# Patient Record
Sex: Female | Born: 1970 | Hispanic: No | Marital: Married | Smoking: Never smoker
Health system: Southern US, Community
[De-identification: ages and names within clinical notes are randomized; demographics above are authoritative.]

## PROBLEM LIST (undated history)

## (undated) DIAGNOSIS — Z789 Other specified health status: Secondary | ICD-10-CM

## (undated) HISTORY — PX: TUBAL LIGATION: SHX77

## (undated) HISTORY — DX: Other specified health status: Z78.9

---

## 1998-12-15 ENCOUNTER — Emergency Department (HOSPITAL_COMMUNITY): Admission: EM | Admit: 1998-12-15 | Discharge: 1998-12-15 | Payer: Self-pay | Admitting: Emergency Medicine

## 2017-10-10 ENCOUNTER — Emergency Department: Payer: PRIVATE HEALTH INSURANCE

## 2017-10-10 ENCOUNTER — Emergency Department
Admission: EM | Admit: 2017-10-10 | Discharge: 2017-10-11 | Disposition: A | Discharge status: Home / Self Care | Payer: PRIVATE HEALTH INSURANCE | Attending: Emergency Medicine | Admitting: Emergency Medicine

## 2017-10-10 DIAGNOSIS — R0602 Shortness of breath: Secondary | ICD-10-CM | POA: Diagnosis not present

## 2017-10-10 DIAGNOSIS — R079 Chest pain, unspecified: Secondary | ICD-10-CM | POA: Diagnosis present

## 2017-10-10 LAB — CBC
HCT: 42.2 % (ref 35.0–47.0)
HEMOGLOBIN: 13.9 g/dL (ref 12.0–16.0)
MCH: 28.6 pg (ref 26.0–34.0)
MCHC: 32.9 g/dL (ref 32.0–36.0)
MCV: 86.8 fL (ref 80.0–100.0)
PLATELETS: 277 10*3/uL (ref 150–440)
RBC: 4.86 MIL/uL (ref 3.80–5.20)
RDW: 14.6 % — ABNORMAL HIGH (ref 11.5–14.5)
WBC: 6.3 10*3/uL (ref 3.6–11.0)

## 2017-10-10 LAB — BASIC METABOLIC PANEL
ANION GAP: 9 (ref 5–15)
BUN: 13 mg/dL (ref 6–20)
CALCIUM: 9.2 mg/dL (ref 8.9–10.3)
CO2: 25 mmol/L (ref 22–32)
CREATININE: 0.59 mg/dL (ref 0.44–1.00)
Chloride: 105 mmol/L (ref 101–111)
GFR calc Af Amer: >60 mL/min (ref >60)
GLUCOSE: 100 mg/dL — AB (ref 65–99)
Potassium: 3.7 mmol/L (ref 3.5–5.1)
Sodium: 139 mmol/L (ref 135–145)

## 2017-10-10 LAB — TROPONIN I

## 2017-10-10 NOTE — ED Provider Notes (Signed)
[facility] Emergency Department Provider Note  ____________________________________________   First MD Initiated Contact with Patient 10/10/17 2352     (approximate)  I have reviewed the triage vital signs and the nursing notes.   HISTORY  Chief Complaint Chest Pain   HPI Alyssa Mcgee is a 47 y.o. female who self presents to the emergency department with chest pain and shortness of breath that began today.  The pain is substernal moderate severity.  Had insidious onset and has been intermittent.  She was seen at an urgent care earlier today and was advised to come to the emergency department.  Her pain is not ripping or tearing does not go straight to her back.  It is not associated with nausea.  She has no history of coronary artery disease.  No history of DVT or pulmonary embolism.  Nothing in particular seems to make the symptoms better or worse.  It is nonexertional.  History reviewed. No pertinent past medical history.  There are no active problems to display for this patient.   Past Surgical History:  Procedure Laterality Date  . TUBAL LIGATION      Prior to Admission medications   Not on File    Allergies Patient has no known allergies.  No family history on file.  Social History Social History   Tobacco Use  . Smoking status: Never Smoker  . Smokeless tobacco: Never Used  Substance Use Topics  . Alcohol use: Yes  . Drug use: Not on file    Review of Systems Constitutional: No fever/chills Eyes: No visual changes. ENT: No sore throat. Cardiovascular: Positive for chest pain. Respiratory: Positive for shortness of breath. Gastrointestinal: No abdominal pain.  No nausea, no vomiting.  No diarrhea.  No constipation. Genitourinary: Negative for dysuria. Musculoskeletal: Negative for back pain. Skin: Negative for rash. Neurological: Negative for headaches, focal weakness or  numbness.   ____________________________________________   PHYSICAL EXAM:  VITAL SIGNS: ED Triage Vitals  Enc Vitals Group     BP 10/10/17 1935 (!) 151/75     Pulse Rate 10/10/17 1935 (!) 53     Resp 10/10/17 1935 (!) 22     Temp 10/10/17 1935 98.6 F (37 C)     Temp Source 10/10/17 1935 Oral     SpO2 10/10/17 1935 98 %     Weight 10/10/17 1936 213 lb (96.6 kg)     Height --      Head Circumference --      Peak Flow --      Pain Score 10/10/17 1936 6     Pain Loc --      Pain Edu? --      Excl. in GC? --     Constitutional: Alert and oriented x4 pleasant cooperative speaks in full clear sentences Eyes: PERRL EOMI. Head: Atraumatic. Nose: No congestion/rhinnorhea. Mouth/Throat: No trismus Neck: No stridor.   Cardiovascular: Bradycardic rate, regular rhythm. Grossly normal heart sounds.  Good peripheral circulation. Respiratory: Normal respiratory effort.  No retractions. Lungs CTAB and moving good air Gastrointestinal: Soft nontender Musculoskeletal: No lower extremity edema   Neurologic:  Normal speech and language. No gross focal neurologic deficits are appreciated. Skin:  Skin is warm, dry and intact. No rash noted. Psychiatric: Mood and affect are normal. Speech and behavior are normal.    ____________________________________________   DIFFERENTIAL includes but not limited to  Acute coronary syndrome, pulmonary embolism, aortic dissection ____________________________________________   LABS (all labs ordered are listed, but only  abnormal results are displayed)  Labs Reviewed  BASIC METABOLIC PANEL - Abnormal; Notable for the following components:      Result Value   Glucose, Bld 100 (*)    All other components within normal limits  CBC - Abnormal; Notable for the following components:   RDW 14.6 (*)    All other components within normal limits  TROPONIN I  TROPONIN I    Lab work reviewed by me with no signs of acute ischemia  x2 __________________________________________  EKG  ED ECG REPORT I, Merrily BrittleNeil Neveen Daponte, the attending physician, personally viewed and interpreted this ECG.  Date: 10/10/2017 EKG Time: 1940 Rate: 58 Rhythm: Sinus bradycardia QRS Axis: normal Intervals: normal ST/T Wave abnormalities: normal Narrative Interpretation: no evidence of acute ischemia  ED ECG REPORT I, Merrily BrittleNeil Jeramia Saleeby, the attending physician, personally viewed and interpreted this ECG.  Date: 10/11/2017 EKG Time: 0001 Rate: 57 Rhythm: Sinus bradycardia QRS Axis: normal Intervals: normal ST/T Wave abnormalities: normal Narrative Interpretation: no evidence of acute ischemia.  Unchanged from previous  ____________________________________________  RADIOLOGY  Chest x-ray reviewed by me with no acute disease ____________________________________________   PROCEDURES  Procedure(s) performed: no  Procedures  Critical Care performed: no  Observation: no ____________________________________________   INITIAL IMPRESSION / ASSESSMENT AND PLAN / ED COURSE  Pertinent labs & imaging results that were available during my care of the patient were reviewed by me and considered in my medical decision making (see chart for details).  The patient arrives with atypical chest pain and a nonischemic EKG.  2 troponins are negative.  Low suspicion for pulmonary embolism and she is in fact PERC negative.  I had a lengthy discussion with the patient regarding the diagnostic uncertainty however I felt she was suitable for outpatient management and possible provocative testing.  She verbalizes understanding and agreement with the plan.  Strict return precautions have been given.      ____________________________________________   FINAL CLINICAL IMPRESSION(S) / ED DIAGNOSES  Final diagnoses:  Chest pain, unspecified type      NEW MEDICATIONS STARTED DURING THIS VISIT:  There are no discharge medications for this  patient.    Note:  This document was prepared using Dragon voice recognition software and may include unintentional dictation errors.     Merrily Brittleifenbark, Diala Waxman, MD 10/12/17 [phone]

## 2017-10-10 NOTE — ED Triage Notes (Signed)
Patient c/o chest pain and SOB all day today with increasing severity 3 hours ago. Patient seen at urgent care today for same and informed she should go to the ER.

## 2017-10-11 DIAGNOSIS — R079 Chest pain, unspecified: Secondary | ICD-10-CM | POA: Diagnosis not present

## 2017-10-11 LAB — TROPONIN I: Troponin I: <0.03 ng/mL (ref <0.03)

## 2017-10-11 NOTE — Discharge Instructions (Signed)
Fortunately today your blood work and your chest x-ray were reassuring.  Please make an appointment to establish care with cardiology this coming Monday for reevaluation and return to the emergency department sooner for any concerns.  It was a pleasure to take care of you today, and thank you for coming to our emergency department.  If you have any questions or concerns before leaving please ask the nurse to grab me and I'm more than happy to go through your aftercare instructions again.  If you were prescribed any opioid pain medication today such as Norco, Vicodin, Percocet, morphine, hydrocodone, or oxycodone please make sure you do not drive when you are taking this medication as it can alter your ability to drive safely.  If you have any concerns once you are home that you are not improving or are in fact getting worse before you can make it to your follow-up appointment, please do not hesitate to call 911 and come back for further evaluation.  Merrily BrittleNeil Tashauna Caisse, MD  Results for orders placed or performed during the hospital encounter of 10/10/17  Basic metabolic panel  Result Value Ref Range   Sodium 139 135 - 145 mmol/L   Potassium 3.7 3.5 - 5.1 mmol/L   Chloride 105 101 - 111 mmol/L   CO2 25 22 - 32 mmol/L   Glucose, Bld 100 (H) 65 - 99 mg/dL   BUN 13 6 - 20 mg/dL   Creatinine, Ser 9.600.59 0.44 - 1.00 mg/dL   Calcium 9.2 8.9 - 45.410.3 mg/dL   GFR calc non Af Amer >60 >60 mL/min   GFR calc Af Amer >60 >60 mL/min   Anion gap 9 5 - 15  CBC  Result Value Ref Range   WBC 6.3 3.6 - 11.0 K/uL   RBC 4.86 3.80 - 5.20 MIL/uL   Hemoglobin 13.9 12.0 - 16.0 g/dL   HCT 09.842.2 11.935.0 - 14.747.0 %   MCV 86.8 80.0 - 100.0 fL   MCH 28.6 26.0 - 34.0 pg   MCHC 32.9 32.0 - 36.0 g/dL   RDW 82.914.6 (H) 56.211.5 - 13.014.5 %   Platelets 277 150 - 440 K/uL  Troponin I  Result Value Ref Range   Troponin I <0.03 <0.03 ng/mL  Troponin I  Result Value Ref Range   Troponin I <0.03 <0.03 ng/mL   Dg Chest 2 View  Result  Date: 10/10/2017 CLINICAL DATA:  Chest pain shortness of breath EXAM: CHEST - 2 VIEW COMPARISON:  None. FINDINGS: The heart size and mediastinal contours are within normal limits. Both lungs are clear. The visualized skeletal structures are unremarkable. IMPRESSION: No active cardiopulmonary disease. Electronically Signed   By: Deatra RobinsonKevin  Herman M.D.   On: 10/10/2017 19:57

## 2017-10-11 NOTE — ED Notes (Signed)
Patient discharge and follow up information reviewed with patient by ED nursing staff and patient given the opportunity to ask questions pertaining to ED visit and discharge plan of care. Patient advised that should symptoms not continue to improve, resolve entirely, or should new symptoms develop then a follow up visit with their PCP or a return visit to the ED may be warranted. Patient verbalized consent and understanding of discharge plan of care including potential need for further evaluation. Patient being discharged in stable condition per attending ED physician on duty.   Pt declined stratus interpreter, pt wanted her husband to translate.

## 2017-10-14 ENCOUNTER — Telehealth: Payer: Self-pay

## 2017-10-14 NOTE — Telephone Encounter (Signed)
Number was incorrect in system °Unable to contact patient  °Needed to schedule ED FU seen on 10/10/17 with CP  °  °Sent letter to patient ° ° °

## 2019-05-05 IMAGING — CR DG CHEST 2V
1 series · 2 of 2 positions shown · non-contrast
Comparison: None.

CLINICAL DATA: Chest pain shortness of breath

EXAM:
CHEST - 2 VIEW

[Series 1: dg chest 2 view · 0.14mm/px · 2 of 2 slices shown]
[im 1/2]
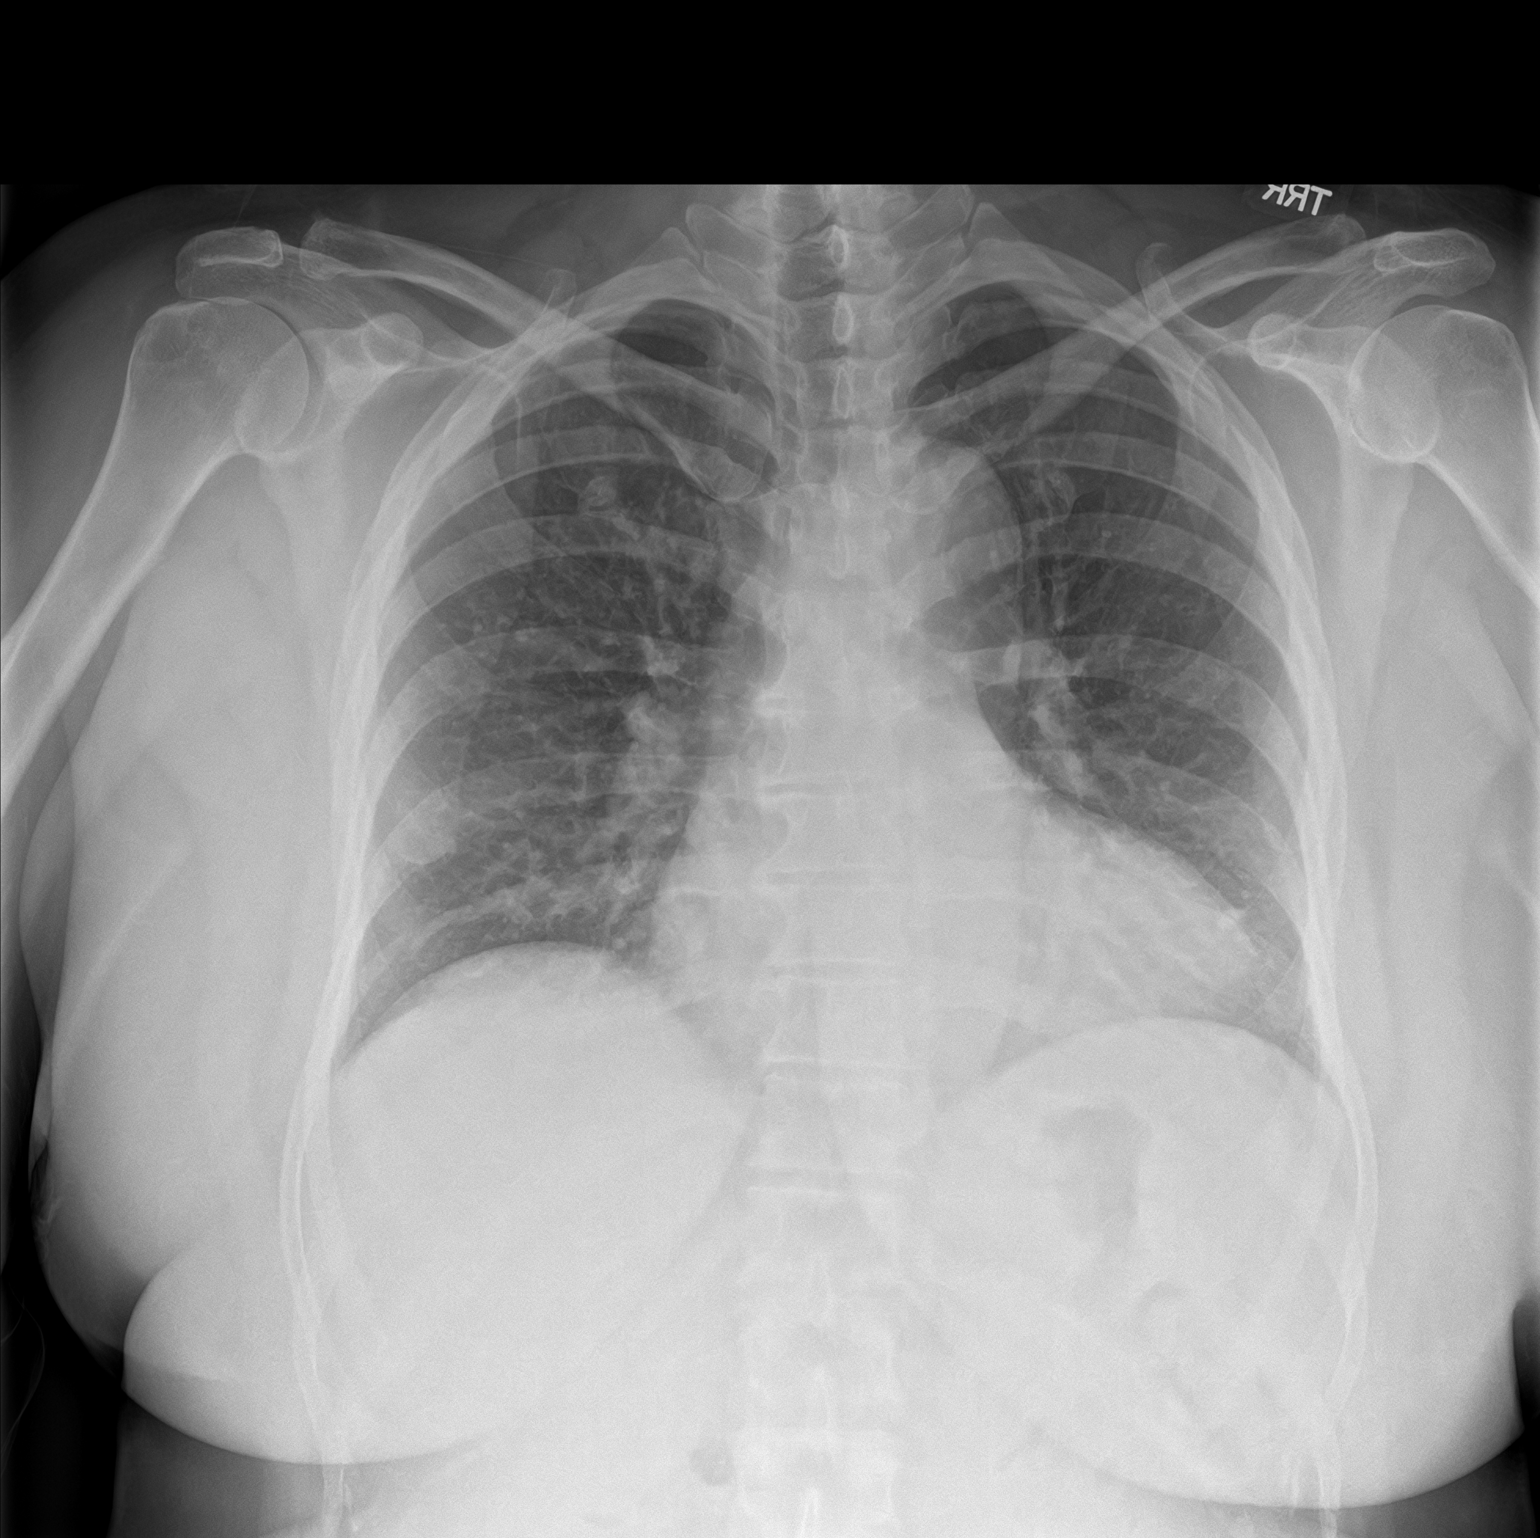
[im 2/2]
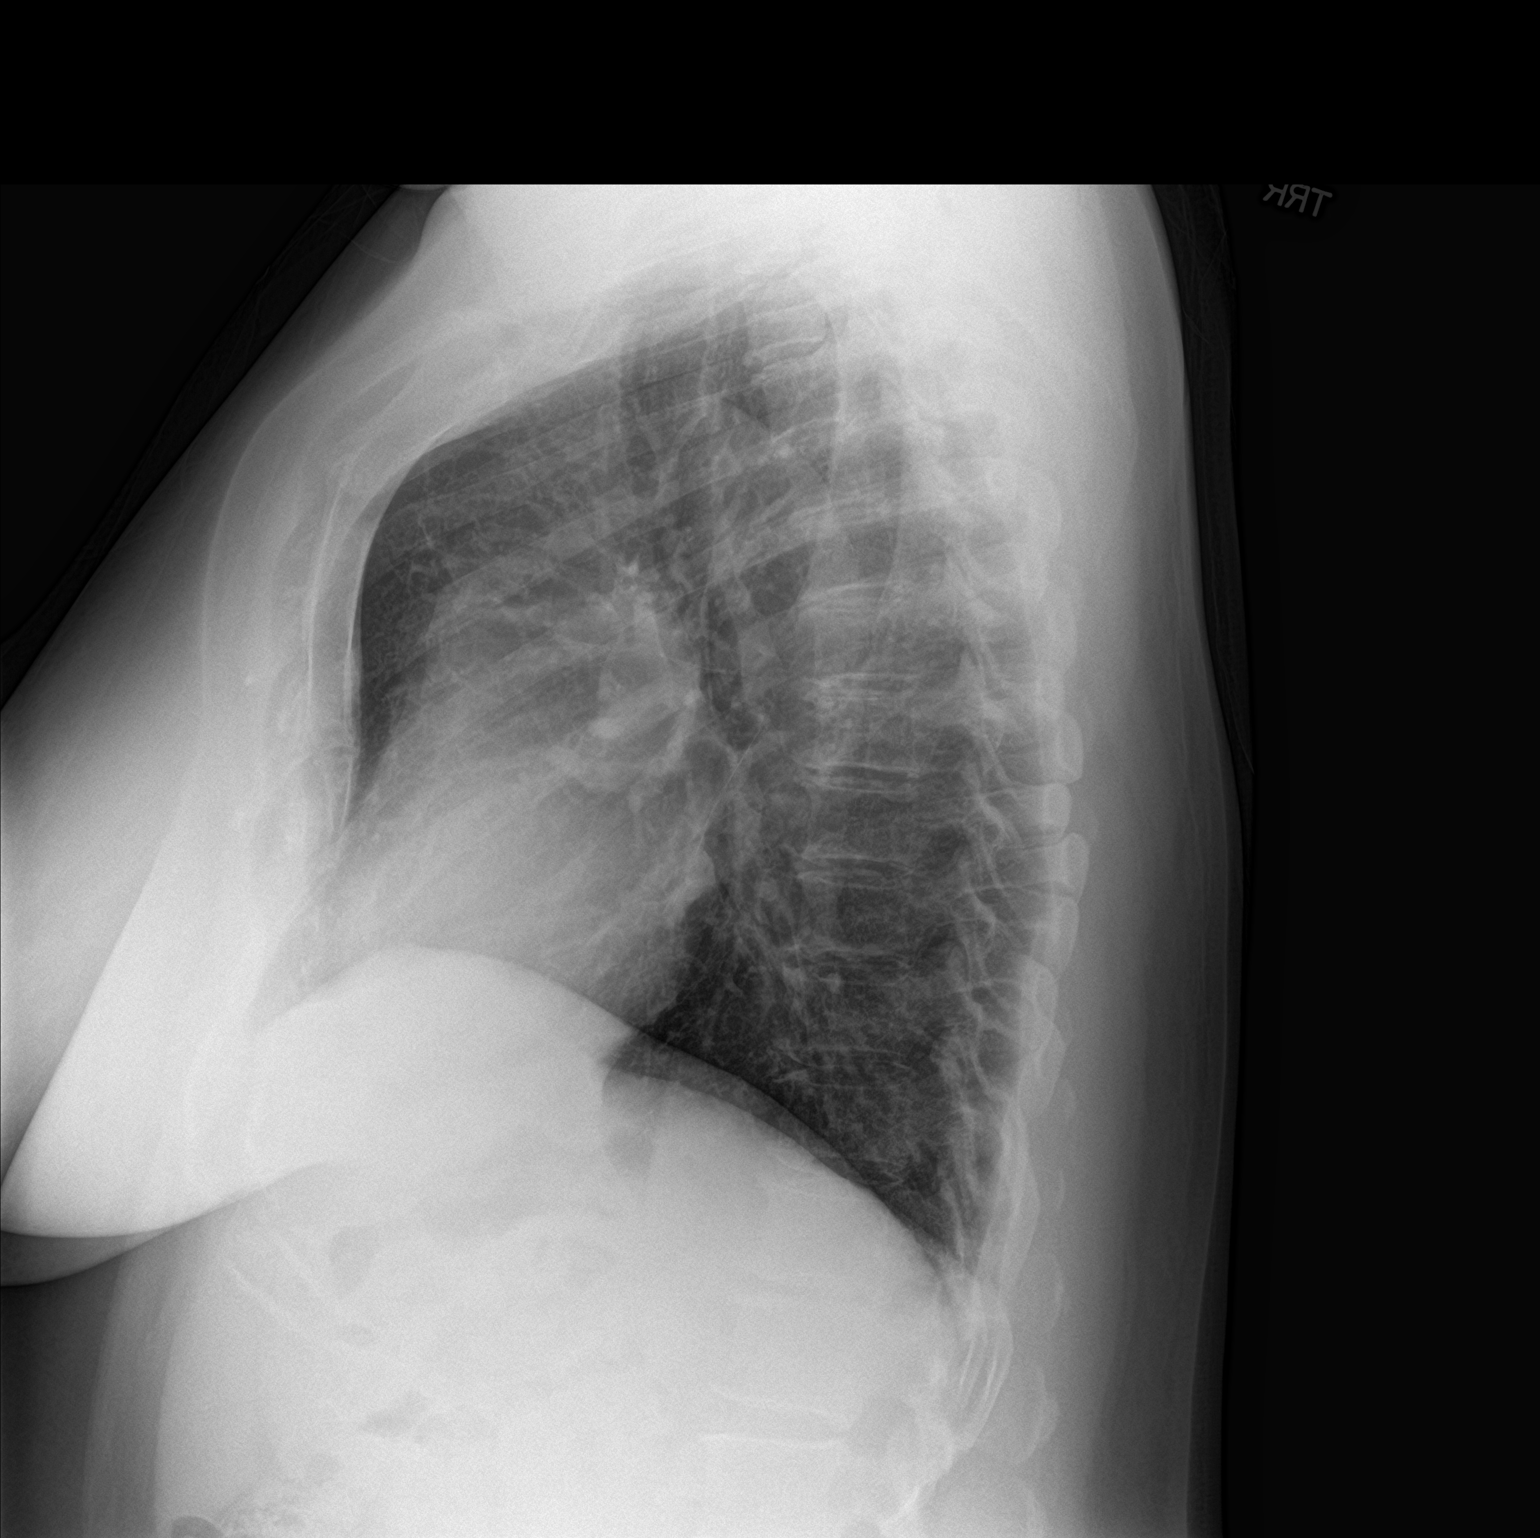

[2 of 2 positions shown; findings below may reference images not displayed]

FINDINGS: The heart size and mediastinal contours are within normal limits.
Both lungs are clear. The visualized skeletal structures are
unremarkable.
IMPRESSION: No active cardiopulmonary disease.

## 2020-08-09 ENCOUNTER — Encounter: Payer: BLUE CROSS/BLUE SHIELD | Admitting: Obstetrics and Gynecology

## 2020-08-09 ENCOUNTER — Other Ambulatory Visit: Payer: Self-pay

## 2023-03-01 ENCOUNTER — Telehealth: Payer: Self-pay

## 2023-03-01 NOTE — Telephone Encounter (Signed)
Copied from CRM [id number]. Topic: General - Other >> Mar 01, 2023  4:37 PM Lennox Pippins wrote: Patient has been scheduled for new patient visit on 05/03/2023. Needs an interpreter.

## 2023-05-02 ENCOUNTER — Ambulatory Visit: Payer: BLUE CROSS/BLUE SHIELD | Admitting: Physician Assistant

## 2023-05-03 ENCOUNTER — Ambulatory Visit: Payer: BLUE CROSS/BLUE SHIELD | Admitting: Physician Assistant

## 2023-05-03 ENCOUNTER — Ambulatory Visit (INDEPENDENT_AMBULATORY_CARE_PROVIDER_SITE_OTHER): Payer: BLUE CROSS/BLUE SHIELD | Admitting: Physician Assistant

## 2023-05-03 DIAGNOSIS — Z7689 Persons encountering health services in other specified circumstances: Secondary | ICD-10-CM

## 2023-05-03 NOTE — Progress Notes (Unsigned)
Patient was not seen for appt d/t no call, no show, or late arrival >10 mins past appt time.    Debera Lat PA [facility] [address] [place], [place] [address] [phone] (phone) [phone] (fax) [facility]

## 2023-06-21 ENCOUNTER — Ambulatory Visit: Payer: BLUE CROSS/BLUE SHIELD | Admitting: Physician Assistant

## 2024-02-17 NOTE — Patient Instructions (Signed)
 Preventive Care 53 Years Old, Female Preventive care refers to lifestyle choices and visits with your health care provider that can promote health and wellness. Preventive care visits are also called wellness exams. What can I expect for my preventive care visit? Counseling Your health care provider may ask you questions about your: Medical history, including: Past medical problems. Family medical history. Pregnancy history. Current health, including: Menstrual cycle. Method of birth control. Emotional well-being. Home life and relationship well-being. Sexual activity and sexual health. Lifestyle, including: Alcohol, nicotine or tobacco, and drug use. Access to firearms. Diet, exercise, and sleep habits. Work and work Astronomer. Sunscreen use. Safety issues such as seatbelt and bike helmet use. Physical exam Your health care provider will check your: Height and weight. These may be used to calculate your BMI (body mass index). BMI is a measurement that tells if you are at a healthy weight. Waist circumference. This measures the distance around your waistline. This measurement also tells if you are at a healthy weight and may help predict your risk of certain diseases, such as type 2 diabetes and high blood pressure. Heart rate and blood pressure. Body temperature. Skin for abnormal spots. What immunizations do I need?  Vaccines are usually given at various ages, according to a schedule. Your health care provider will recommend vaccines for you based on your age, medical history, and lifestyle or other factors, such as travel or where you work. What tests do I need? Screening Your health care provider may recommend screening tests for certain conditions. This may include: Lipid and cholesterol levels. Diabetes screening. This is done by checking your blood sugar (glucose) after you have not eaten for a while (fasting). Pelvic exam and Pap test. Hepatitis B test. Hepatitis C  test. HIV (human immunodeficiency virus) test. STI (sexually transmitted infection) testing, if you are at risk. Lung cancer screening. Colorectal cancer screening. Mammogram. Talk with your health care provider about when you should start having regular mammograms. This may depend on whether you have a family history of breast cancer. BRCA-related cancer screening. This may be done if you have a family history of breast, ovarian, tubal, or peritoneal cancers. Bone density scan. This is done to screen for osteoporosis. Talk with your health care provider about your test results, treatment options, and if necessary, the need for more tests. Follow these instructions at home: Eating and drinking  Eat a diet that includes fresh fruits and vegetables, whole grains, lean protein, and low-fat dairy products. Take vitamin and mineral supplements as recommended by your health care provider. Do not drink alcohol if: Your health care provider tells you not to drink. You are pregnant, may be pregnant, or are planning to become pregnant. If you drink alcohol: Limit how much you have to 0-1 drink a day. Know how much alcohol is in your drink. In the [place] one drink equals one 12 oz bottle of beer (355 mL), one 5 oz glass of wine (148 mL), or one 1 oz glass of hard liquor (44 mL). Lifestyle Brush your teeth every morning and night with fluoride toothpaste. Floss one time each day. Exercise for at least 30 minutes 5 or more days each week. Do not use any products that contain nicotine or tobacco. These products include cigarettes, chewing tobacco, and vaping devices, such as e-cigarettes. If you need help quitting, ask your health care provider. Do not use drugs. If you are sexually active, practice safe sex. Use a condom or other form of protection to  prevent STIs. If you do not wish to become pregnant, use a form of birth control. If you plan to become pregnant, see your health care provider for a  prepregnancy visit. Take aspirin only as told by your health care provider. Make sure that you understand how much to take and what form to take. Work with your health care provider to find out whether it is safe and beneficial for you to take aspirin daily. Find healthy ways to manage stress, such as: Meditation, yoga, or listening to music. Journaling. Talking to a trusted person. Spending time with friends and family. Minimize exposure to UV radiation to reduce your risk of skin cancer. Safety Always wear your seat belt while driving or riding in a vehicle. Do not drive: If you have been drinking alcohol. Do not ride with someone who has been drinking. When you are tired or distracted. While texting. If you have been using any mind-altering substances or drugs. Wear a helmet and other protective equipment during sports activities. If you have firearms in your house, make sure you follow all gun safety procedures. Seek help if you have been physically or sexually abused. What's next? Visit your health care provider once a year for an annual wellness visit. Ask your health care provider how often you should have your eyes and teeth checked. Stay up to date on all vaccines. This information is not intended to replace advice given to you by your health care provider. Make sure you discuss any questions you have with your health care provider. Document Revised: 12/28/2020 Document Reviewed: 12/28/2020 Elsevier Patient Education  2024 [employer] Breast Self-Awareness Breast self-awareness is knowing how your breasts look and feel. You need to: Check your breasts on a regular basis. Tell your doctor about any changes. Become familiar with the look and feel of your breasts. This can help you catch a breast problem while it is still small and can be treated. You should do breast self-exams even if you have breast implants. What you need: A mirror. A well-lit room. A pillow or other  soft object. How to do a breast self-exam Follow these steps to do a breast self-exam: Look for changes  Take off all the clothes above your waist. Stand in front of a mirror in a room with good lighting. Put your hands down at your sides. Compare your breasts in the mirror. Look for any difference between them, such as: A difference in shape. A difference in size. Wrinkles, dips, and bumps in one breast and not the other. Look at each breast for changes in the skin, such as: Redness. Scaly areas. Skin that has gotten thicker. Dimpling. Open sores (ulcers). Look for changes in your nipples, such as: Fluid coming out of a nipple. Fluid around a nipple. Bleeding. Dimpling. Redness. A nipple that looks pushed in (retracted), or that has changed position. Feel for changes Lie on your back. Feel each breast. To do this: Pick a breast to feel. Place a pillow under the shoulder closest to that breast. Put the arm closest to that breast behind your head. Feel the nipple area of that breast using the hand of your other arm. Feel the area with the pads of your three middle fingers by making small circles with your fingers. Use light, medium, and firm pressure. Continue the overlapping circles, moving downward over the breast. Keep making circles with your fingers. Stop when you feel your ribs. Start making circles with your fingers again, this time going  upward until you reach your collarbone. Then, make circles outward across your breast and into your armpit area. Squeeze your nipple. Check for discharge and lumps. Repeat these steps to check your other breast. Sit or stand in the tub or shower. With soapy water on your skin, feel each breast the same way you did when you were lying down. Write down what you find Writing down what you find can help you remember what to tell your doctor. Write down: What is normal for each breast. Any changes you find in each breast. These  include: The kind of changes you find. A tender or painful breast. Any lump you find. Write down its size and where it is. When you last had your monthly period (menstrual cycle). General tips If you are breastfeeding, the best time to check your breasts is after you feed your baby or after you use a breast pump. If you get monthly bleeding, the best time to check your breasts is 5-7 days after your monthly cycle ends. With time, you will become comfortable with the self-exam. You will also start to know if there are changes in your breasts. Contact a doctor if: You see a change in the shape or size of your breasts or nipples. You see a change in the skin of your breast or nipples, such as red or scaly skin. You have fluid coming from your nipples that is not normal. You find a new lump or thick area. You have breast pain. You have any concerns about your breast health. Summary Breast self-awareness includes looking for changes in your breasts and feeling for changes within your breasts. You should do breast self-awareness in front of a mirror in a well-lit room. If you get monthly periods (menstrual cycles), the best time to check your breasts is 5-7 days after your period ends. Tell your doctor about any changes you see in your breasts. Changes include changes in size, changes on the skin, painful or tender breasts, or fluid from your nipples that is not normal. This information is not intended to replace advice given to you by your health care provider. Make sure you discuss any questions you have with your health care provider. Document Revised: 12/07/2021 Document Reviewed: 05/04/2021 Elsevier Patient Education  2024 [employer].

## 2024-02-17 NOTE — Progress Notes (Unsigned)
 ANNUAL PREVENTATIVE CARE GYNECOLOGY  ENCOUNTER NOTE  Subjective:       Alyssa Mcgee is a 53 y.o. No obstetric history on file. female here for a routine annual gynecologic exam. The patient {is/is not/has never been:13135} sexually active. The patient {is/is not:13135} taking hormone replacement therapy. {post-men bleed:13152::Patient denies post-menopausal vaginal bleeding.} The patient wears seatbelts: {yes/no:311178}. The patient participates in regular exercise: {yes/no/not asked:9010}. Has the patient ever been transfused or tattooed?: {yes/no/not asked:9010}. The patient reports that there {is/is not:9024} domestic violence in her life.  Current complaints: 1.  ***    Gynecologic History No LMP recorded. (Menstrual status: Other). Contraception: {method:5051} Last Pap: ***. Results were: {norm/abn:16337} Last mammogram: ***. Results were: {norm/abn:16337} Last Colonoscopy:  Last Dexa Scan:    Obstetric History OB History  No obstetric history on file.    No past medical history on file.  No family history on file.  Past Surgical History:  Procedure Laterality Date   TUBAL LIGATION      Social History   Socioeconomic History   Marital status: Married    Spouse name: Not on file   Number of children: Not on file   Years of education: Not on file   Highest education level: Not on file  Occupational History   Not on file  Tobacco Use   Smoking status: Never   Smokeless tobacco: Never  Substance and Sexual Activity   Alcohol use: Yes   Drug use: Not on file   Sexual activity: Not on file  Other Topics Concern   Not on file  Social History Narrative   Not on file   Social Drivers of Health   Financial Resource Strain: Not on file  Food Insecurity: Not on file  Transportation Needs: Not on file  Physical Activity: Not on file  Stress: Not on file  Social Connections: Unknown (11/28/2021)   [facility]   Social Network    Social  Network: Not on file  Intimate Partner Violence: Unknown (10/20/2021)   Received from [facility]   HITS    Physically Hurt: Not on file    Insult or Talk Down To: Not on file    Threaten Physical Harm: Not on file    Scream or Curse: Not on file    No current outpatient medications on file prior to visit.   No current facility-administered medications on file prior to visit.    No Known Allergies    Review of Systems ROS Review of Systems - General ROS: negative for - chills, fatigue, fever, hot flashes, night sweats, weight gain or weight loss Psychological ROS: negative for - anxiety, decreased libido, depression, mood swings, physical abuse or sexual abuse Ophthalmic ROS: negative for - blurry vision, eye pain or loss of vision ENT ROS: negative for - headaches, hearing change, visual changes or vocal changes Allergy and Immunology ROS: negative for - hives, itchy/watery eyes or seasonal allergies Hematological and Lymphatic ROS: negative for - bleeding problems, bruising, swollen lymph nodes or weight loss Endocrine ROS: negative for - galactorrhea, hair pattern changes, hot flashes, malaise/lethargy, mood swings, palpitations, polydipsia/polyuria, skin changes, temperature intolerance or unexpected weight changes Breast ROS: negative for - new or changing breast lumps or nipple discharge Respiratory ROS: negative for - cough or shortness of breath Cardiovascular ROS: negative for - chest pain, irregular heartbeat, palpitations or shortness of breath Gastrointestinal ROS: no abdominal pain, change in bowel habits, or black or bloody stools Genito-Urinary ROS: no dysuria,  trouble voiding, or hematuria Musculoskeletal ROS: negative for - joint pain or joint stiffness Neurological ROS: negative for - bowel and bladder control changes Dermatological ROS: negative for rash and skin lesion changes   Objective:   There were no vitals taken for this visit. CONSTITUTIONAL:  Well-developed, well-nourished female in no acute distress.  PSYCHIATRIC: Normal mood and affect. Normal behavior. Normal judgment and thought content. NEUROLGIC: Alert and oriented to person, place, and time. Normal muscle tone coordination. No cranial nerve deficit noted. HENT:  Normocephalic, atraumatic, External right and left ear normal. Oropharynx is clear and moist EYES: Conjunctivae and EOM are normal. Pupils are equal, round, and reactive to light. No scleral icterus.  NECK: Normal range of motion, supple, no masses.  Normal thyroid.  SKIN: Skin is warm and dry. No rash noted. Not diaphoretic. No erythema. No pallor. CARDIOVASCULAR: Normal heart rate noted, regular rhythm, no murmur. RESPIRATORY: Clear to auscultation bilaterally. Effort and breath sounds normal, no problems with respiration noted. BREASTS: Symmetric in size. No masses, skin changes, nipple drainage, or lymphadenopathy. ABDOMEN: Soft, normal bowel sounds, no distention noted.  No tenderness, rebound or guarding.  BLADDER: Normal PELVIC:  Bladder {:311640}  Urethra: {:311719}  Vulva: {:311722}  Vagina: {:311643}  Cervix: {:311644}  Uterus: {:311718}  Adnexa: {:311645}  RV: {Blank multiple:19196::External Exam NormaI,No Rectal Masses,Normal Sphincter tone}  MUSCULOSKELETAL: Normal range of motion. No tenderness.  No cyanosis, clubbing, or edema.  2+ distal pulses. LYMPHATIC: No Axillary, Supraclavicular, or Inguinal Adenopathy.   Labs: Lab Results  Component Value Date   WBC 6.3 10/10/2017   HGB 13.9 10/10/2017   HCT 42.2 10/10/2017   MCV 86.8 10/10/2017   PLT 277 10/10/2017    Lab Results  Component Value Date   CREATININE 0.59 10/10/2017   BUN 13 10/10/2017   NA 139 10/10/2017   K 3.7 10/10/2017   CL 105 10/10/2017   CO2 25 10/10/2017    No results found for: ALT, AST, GGT, ALKPHOS, BILITOT  No results found for: CHOL, HDL, LDLCALC, LDLDIRECT, TRIG, CHOLHDL  No  results found for: TSH  No results found for: HGBA1C   Assessment:   No diagnosis found.   Plan:  Pap: {Blank multiple:19196::Pap, Reflex if ASCUS,Pap Co Test,GC/CT NAAT,Not needed,Not done} Mammogram: {Blank multiple:19196::***,Ordered,Not Ordered,Not Indicated} Colon Screening:  {Blank multiple:19196::***,Ordered,Not Ordered,Not Indicated} Labs: {Blank multiple:19196::Lipid 1,FBS,TSH,Hemoglobin A1C,Vit D Level***} Routine preventative health maintenance measures emphasized: {Blank multiple:19196::Exercise/Diet/Weight control,Tobacco Warnings,Alcohol/Substance use risks,Stress Management,Peer Pressure Issues,Safe Sex} Flu vaccine status: COVID Vaccination status: Return to Clinic - 1 Year   [employer], [place] [facility]

## 2024-02-18 ENCOUNTER — Other Ambulatory Visit (HOSPITAL_COMMUNITY)
Admission: RE | Admit: 2024-02-18 | Discharge: 2024-02-18 | Disposition: A | Discharge status: Home / Self Care | Source: Ambulatory Visit | Attending: Certified Nurse Midwife | Admitting: Certified Nurse Midwife

## 2024-02-18 ENCOUNTER — Encounter: Payer: Self-pay | Admitting: Certified Nurse Midwife

## 2024-02-18 ENCOUNTER — Ambulatory Visit (INDEPENDENT_AMBULATORY_CARE_PROVIDER_SITE_OTHER): Payer: Self-pay | Admitting: Certified Nurse Midwife

## 2024-02-18 VITALS — BP 131/85 | HR 70 | Resp 16 | Ht 66.0 in | Wt 239.0 lb

## 2024-02-18 DIAGNOSIS — Z124 Encounter for screening for malignant neoplasm of cervix: Secondary | ICD-10-CM

## 2024-02-18 DIAGNOSIS — Z01419 Encounter for gynecological examination (general) (routine) without abnormal findings: Secondary | ICD-10-CM

## 2024-02-18 DIAGNOSIS — R7303 Prediabetes: Secondary | ICD-10-CM | POA: Diagnosis not present

## 2024-02-18 DIAGNOSIS — Z1211 Encounter for screening for malignant neoplasm of colon: Secondary | ICD-10-CM

## 2024-02-18 DIAGNOSIS — Z13220 Encounter for screening for lipoid disorders: Secondary | ICD-10-CM

## 2024-02-18 DIAGNOSIS — Z1231 Encounter for screening mammogram for malignant neoplasm of breast: Secondary | ICD-10-CM

## 2024-02-18 DIAGNOSIS — Z131 Encounter for screening for diabetes mellitus: Secondary | ICD-10-CM

## 2024-02-19 LAB — COMPREHENSIVE METABOLIC PANEL WITH GFR
ALT: 18 IU/L (ref 0–32)
AST: 17 IU/L (ref 0–40)
Albumin: 4.3 g/dL (ref 3.8–4.9)
Alkaline Phosphatase: 113 IU/L (ref 44–121)
BUN/Creatinine Ratio: 28 — ABNORMAL HIGH (ref 9–23)
BUN: 17 mg/dL (ref 6–24)
Bilirubin Total: 0.5 mg/dL (ref 0.0–1.2)
CO2: 21 mmol/L (ref 20–29)
Calcium: 9.2 mg/dL (ref 8.7–10.2)
Chloride: 104 mmol/L (ref 96–106)
Creatinine, Ser: 0.61 mg/dL (ref 0.57–1.00)
Globulin, Total: 2.3 g/dL (ref 1.5–4.5)
Glucose: 96 mg/dL (ref 70–99)
Potassium: 4.6 mmol/L (ref 3.5–5.2)
Sodium: 140 mmol/L (ref 134–144)
Total Protein: 6.6 g/dL (ref 6.0–8.5)
eGFR: 106 mL/min/1.73 (ref >59)

## 2024-02-19 LAB — CBC
Hematocrit: 41.2 % (ref 34.0–46.6)
Hemoglobin: 13.4 g/dL (ref 11.1–15.9)
MCH: 28.9 pg (ref 26.6–33.0)
MCHC: 32.5 g/dL (ref 31.5–35.7)
MCV: 89 fL (ref 79–97)
Platelets: 305 x10E3/uL (ref 150–450)
RBC: 4.64 x10E6/uL (ref 3.77–5.28)
RDW: 13.4 % (ref 11.7–15.4)
WBC: 7.1 x10E3/uL (ref 3.4–10.8)

## 2024-02-19 LAB — LIPID PANEL
Chol/HDL Ratio: 3.5 ratio (ref 0.0–4.4)
Cholesterol, Total: 171 mg/dL (ref 100–199)
HDL: 49 mg/dL (ref >39)
LDL Chol Calc (NIH): 92 mg/dL (ref 0–99)
Triglycerides: 174 mg/dL — ABNORMAL HIGH (ref 0–149)
VLDL Cholesterol Cal: 30 mg/dL (ref 5–40)

## 2024-02-19 LAB — TSH: TSH: 0.745 u[IU]/mL (ref 0.450–4.500)

## 2024-02-19 LAB — HEMOGLOBIN A1C
Est. average glucose Bld gHb Est-mCnc: 123 mg/dL
Hgb A1c MFr Bld: 5.9 % — ABNORMAL HIGH (ref 4.8–5.6)

## 2024-02-24 LAB — CYTOLOGY - PAP
Comment: NEGATIVE
Diagnosis: NEGATIVE
High risk HPV: NEGATIVE

## 2024-02-28 ENCOUNTER — Telehealth: Payer: Self-pay

## 2024-02-28 NOTE — Telephone Encounter (Signed)
 Pt stopped by office to check on test results from her visit on 02/18/24. She said she had trouble with her phone and had to buy a new one. Advised msg would be sent to provider.

## 2024-03-04 NOTE — Telephone Encounter (Signed)
 Per Damien:  Can you call her and let her know that her labs are normal except her diabetes testing was borderline high. She does not have diabetes but she should see a PCP. I referred her to a family practice since she does not have one.  Called pt, taken straight to voice mail and mailbox not set up. Will try again later.

## 2024-03-09 ENCOUNTER — Telehealth: Payer: Self-pay

## 2024-03-09 NOTE — Telephone Encounter (Signed)
 Called pt, no answer, LVMTRC.

## 2024-03-09 NOTE — Telephone Encounter (Signed)
 Attempted to reach patient. No answer and not able to LVM.

## 2024-03-10 NOTE — Telephone Encounter (Signed)
 Called pt and she is aware  ?
